# Patient Record
Sex: Male | Born: 1945 | Race: White | Hispanic: No | Smoking: Never smoker
Health system: Southern US, Community
[De-identification: ages and names within clinical notes are randomized; demographics above are authoritative.]

---

## 2011-08-19 LAB — COMPREHENSIVE METABOLIC PANEL
Anion Gap: 10 (ref 7–16)
BUN: 10 mg/dL (ref 7–18)
Calcium, Total: 8.6 mg/dL (ref 8.5–10.1)
Chloride: 105 mmol/L (ref 98–107)
EGFR (African American): >60
EGFR (Non-African Amer.): >60
Potassium: 3.7 mmol/L (ref 3.5–5.1)
SGOT(AST): 43 U/L — ABNORMAL HIGH (ref 15–37)
Sodium: 143 mmol/L (ref 136–145)
Total Protein: 7.4 g/dL (ref 6.4–8.2)

## 2011-08-19 LAB — SALICYLATE LEVEL: Salicylates, Serum: <1.7 mg/dL

## 2011-08-19 LAB — CBC
HCT: 48.2 % (ref 40.0–52.0)
MCH: 31.2 pg (ref 26.0–34.0)
MCV: 89 fL (ref 80–100)
Platelet: 249 10*3/uL (ref 150–440)
RDW: 13.3 % (ref 11.5–14.5)
WBC: 11.7 10*3/uL — ABNORMAL HIGH (ref 3.8–10.6)

## 2011-08-19 LAB — ETHANOL: Ethanol %: 0.289 % — ABNORMAL HIGH (ref 0.000–0.080)

## 2011-08-20 ENCOUNTER — Inpatient Hospital Stay: Payer: Self-pay | Admitting: Psychiatry

## 2011-08-20 LAB — DRUG SCREEN, URINE
Amphetamines, Ur Screen: NEGATIVE (ref ?–1000)
Barbiturates, Ur Screen: NEGATIVE (ref ?–200)
Benzodiazepine, Ur Scrn: POSITIVE (ref ?–200)
Cannabinoid 50 Ng, Ur ~~LOC~~: NEGATIVE (ref ?–50)
Cocaine Metabolite,Ur ~~LOC~~: NEGATIVE (ref ?–300)
Methadone, Ur Screen: NEGATIVE (ref ?–300)
Opiate, Ur Screen: NEGATIVE (ref ?–300)
Phencyclidine (PCP) Ur S: NEGATIVE (ref ?–25)
Tricyclic, Ur Screen: NEGATIVE (ref ?–1000)

## 2014-02-21 IMAGING — CT CT HEAD WITHOUT CONTRAST
2 series · 16 of 30 positions shown, 20 images · non-contrast
Comparison: none

REASON FOR EXAM: FALL, ABRASIONS TO HEAD
COMMENTS:   May transport without cardiac monitor

[Series 2: without · axial · non-contrast · 0.43mm/px · z∈[-44,+86]mm · 13 of 32 slices shown, 17 images]
[im 3/32  brain]
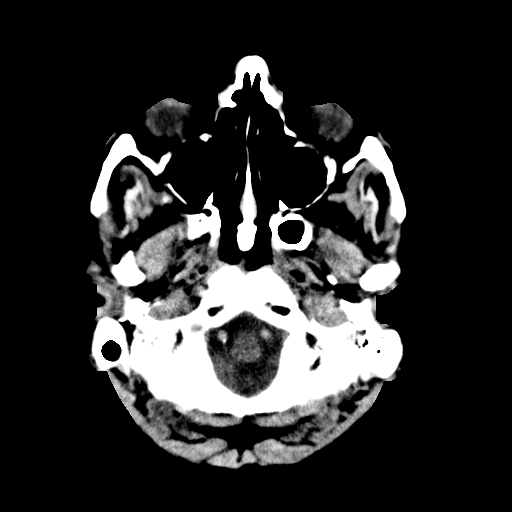
[im 3/32  bone]
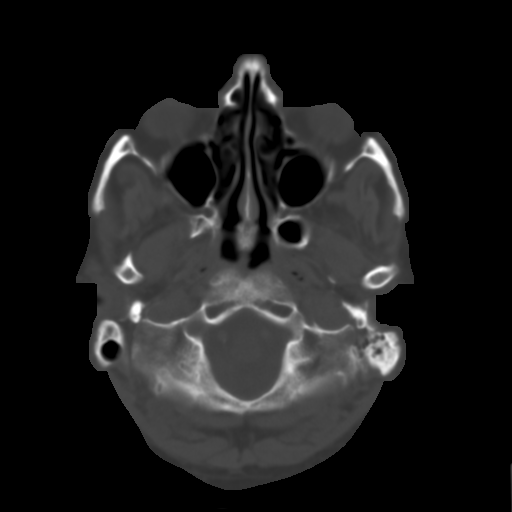
[im 5/32  brain]
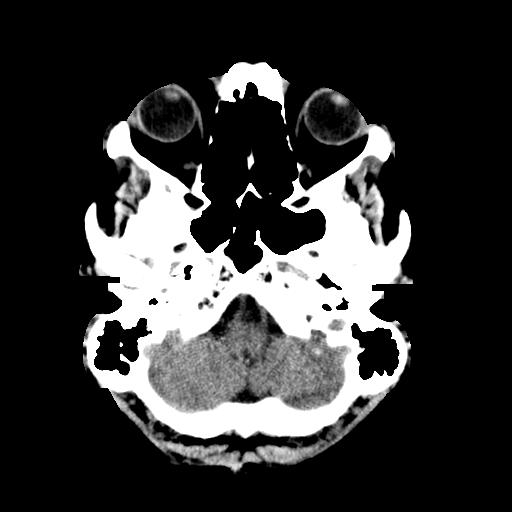
[im 7/32  brain]
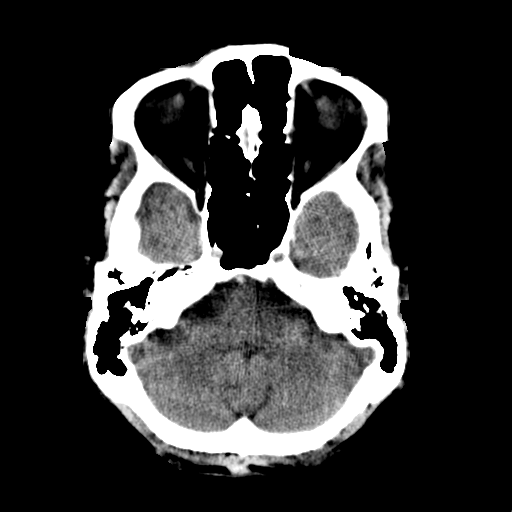
[im 9/32  brain]
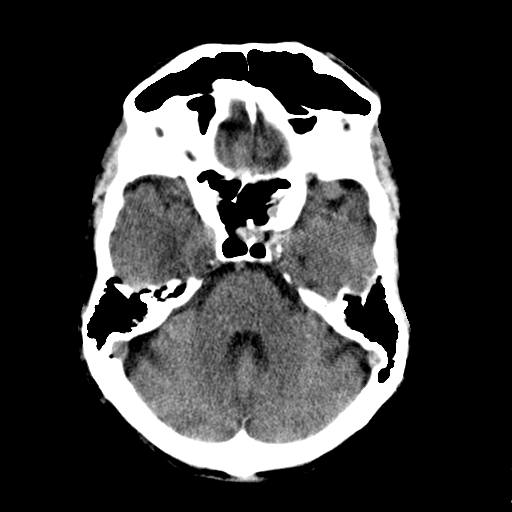
[im 12/32  brain]
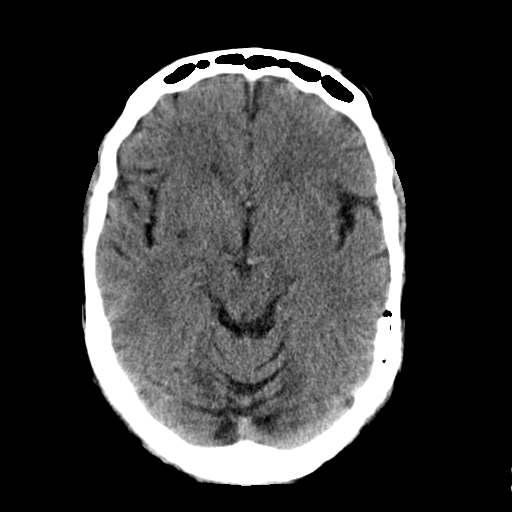
[im 12/32  bone]
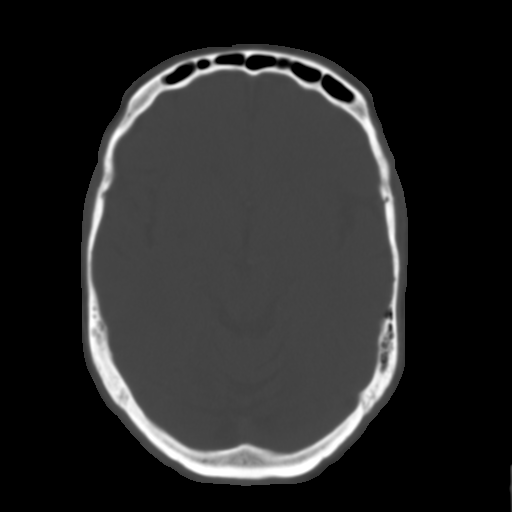
[im 14/32  brain]
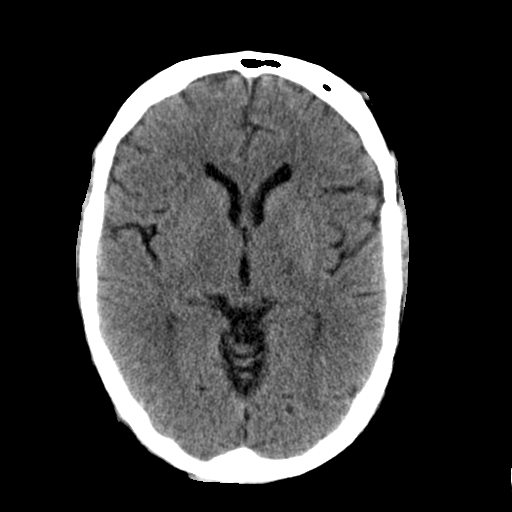
[im 16/32  brain]
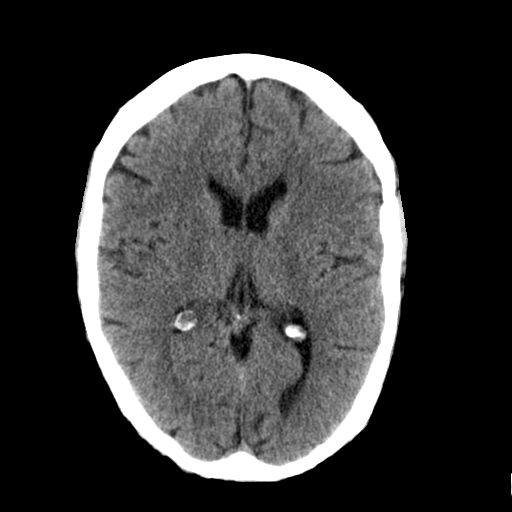
[im 18/32  brain]
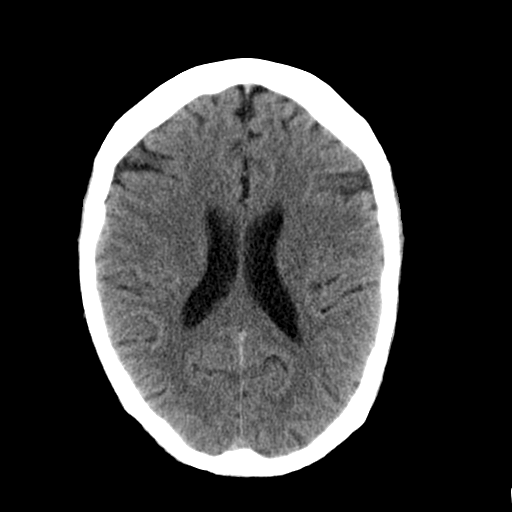
[im 20/32  brain]
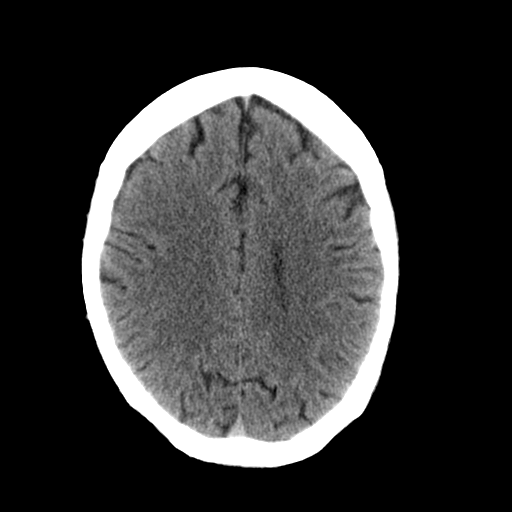
[im 20/32  bone]
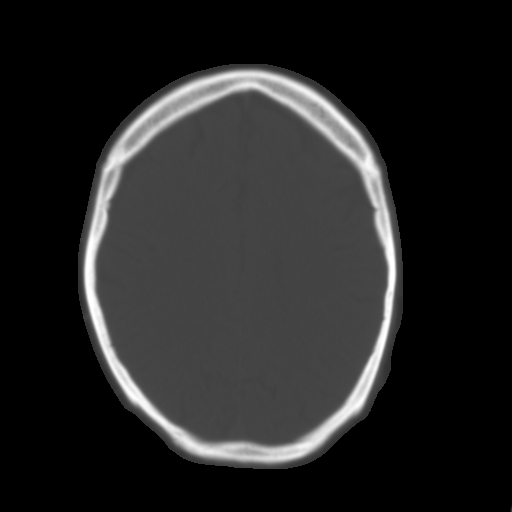
[im 23/32  brain]
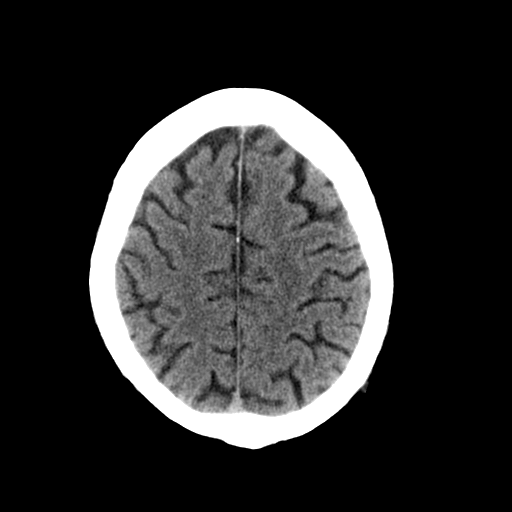
[im 25/32  brain]
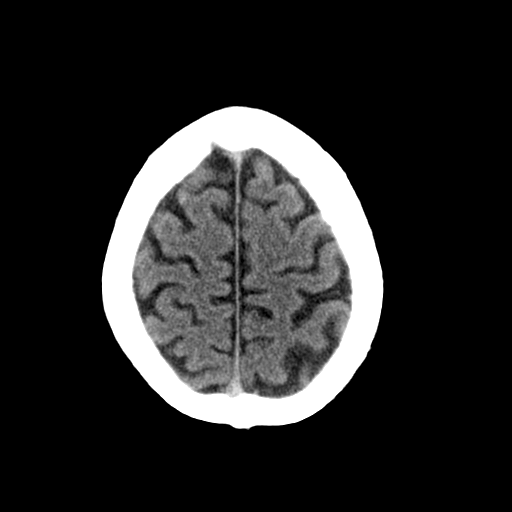
[im 27/32  brain]
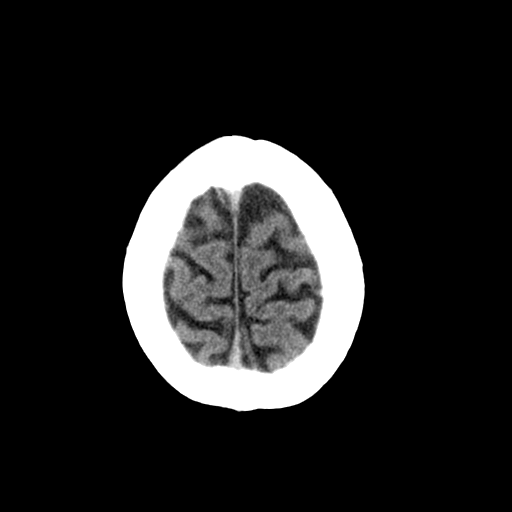
[im 29/32  brain]
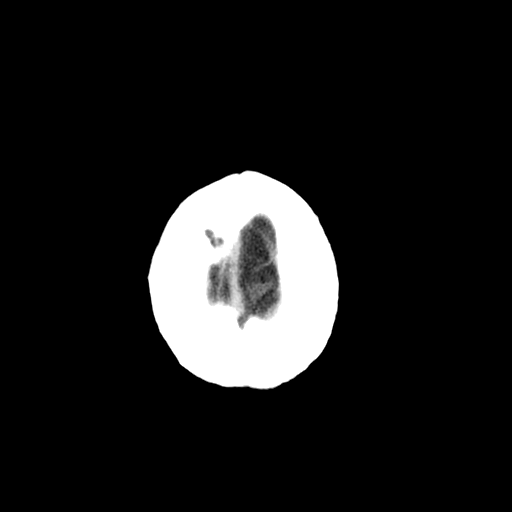
[im 29/32  bone]
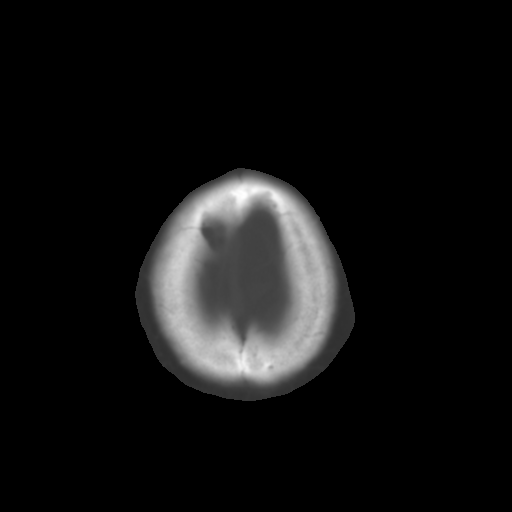

[Series 3: bone · axial · 0.43mm/px · z∈[-44,+1]mm · 3 of 32 slices shown]
[im 3/32  bone]
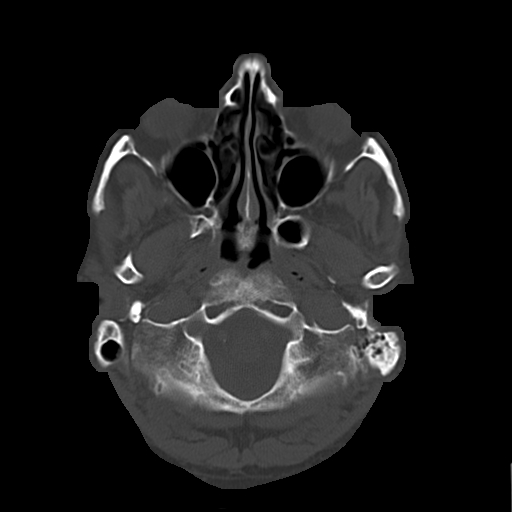
[im 7/32  bone]
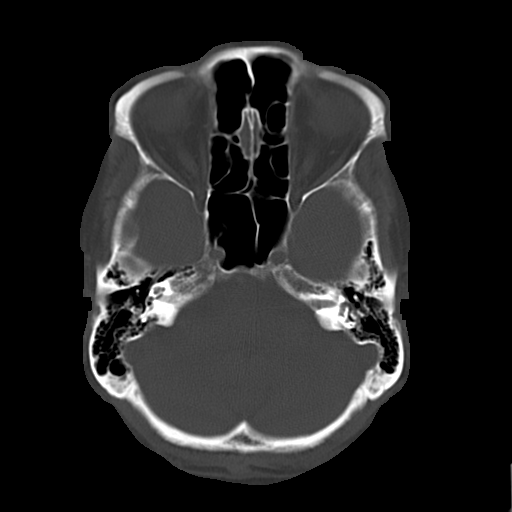
[im 12/32  bone]
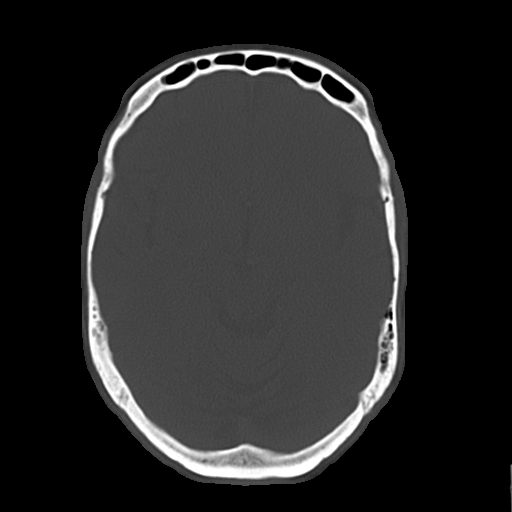

[16 of 30 positions shown; findings below may reference images not displayed]

PROCEDURE:     CT  - CT HEAD WITHOUT CONTRAST  - August 19, 2011 [DATE]

RESULT:     Axial noncontrast CT scanning was performed through the brain
with reconstructions 5 mm intervals and slice thicknesses.

There is very mild diffuse cerebral and cerebellar atrophy. The ventricles
are normal in size and position. There is basal ganglia calcification on the
left. There is no intracranial mass effect. There is no evidence of an
evolving ischemic infarction nor evidence of an intracranial hemorrhage. At
bone window settings the observed portions of the paranasal sinuses and
mastoid air cells are clear. There is no evidence of an acute skull fracture.
IMPRESSION: There is no acute intracranial abnormality.

A preliminary report was sent to the [HOSPITAL] the conclusion
of the study.

## 2014-04-22 NOTE — Discharge Summary (Signed)
PATIENT NAME:  Justin Cortez, Justin Cortez MR#:  [id number] DATE OF BIRTH:  03-24-1945  DATE OF ADMISSION:  08/20/2011 DATE OF DISCHARGE:    HOSPITAL COURSE: See dictated history and physical for details of admission. This is a 69 year old man who presented to the Emergency Room requesting detox from alcohol. He denied suicidal ideation. Was mildly anxious. Not depressed. He was admitted to the unit and put on the CIWA protocol. He did have high blood pressure and high pulse and was given several doses of lorazepam but his tremor resolved quickly. The patient did not appear to be sick. No signs of delirium. He was only minimally participating in groups. He was offered the opportunity to go either to a long-term inpatient program to go to the intensive outpatient program but he has declined both of these. He says he's already seen at the [facility] in [place] and intends to follow-up there. He has been counseled about the importance of also engaging in Alcoholics Anonymous so that he has every day 24 hour a day contacts and support to help with staying sober. Substance abuse education has been done with an emphasis on the importance of really getting into sustained treatment. There was no indication to start any psychiatric medicine. The patient did continue to run high blood pressures even with detox. I have counseled him that he may very well have hypertension. I have started him on hydrochlorothiazide 25 mg a day which has helped a little bit. He is counseled that he should follow-up with his primary care doctor and for the time being stay on the blood pressure medicine.   DISCHARGE MEDICATION: Hydrochlorothiazide 25 mg p.o. daily.   LABORATORY RESULTS: Tests done in the Emergency Room showed a TSH normal at 2.05. Alcohol level 289. Chemistries normal except for an AST elevated at 43. Drug screen positive for benzodiazepines. CBC showed a slightly high white blood cell count at 11.7,  otherwise normal. Salicylates undetectable. Head CT because of a fall was normal.   MENTAL STATUS EXAM AT DISCHARGE: Neatly dressed and groomed man. Alert and oriented. Cooperative. Good eye contact. Normal psychomotor activity. Speech normal in rate, tone, and volume. Affect slightly blunted but not severely so. Mood stated as good. Thoughts are lucid with no evidence of loosening of associations or delusional thinking. Denies suicidal or homicidal ideation. Denies hallucinations. Shows reasonable insight and judgment. Normal intelligence. Good short and long-term memory.   DISPOSITION: [facility] Also encouraged to engage in Alcoholics Anonymous.   DIAGNOSES PRINCIPLE AND PRIMARY:  AXIS I: Alcohol dependence.   SECONDARY DIAGNOSES:  AXIS I: No further.   AXIS II: Deferred.   AXIS III: Hypertension.   AXIS IV: Moderate stress from recent social losses and isolation.   AXIS V: Functioning at time of discharge 56.   ____________________________ Gonzella Lex, MD jtc:drc D: 08/23/2011 12:22:34 ET T: 08/23/2011 14:25:14 ET JOB#: [id number]  cc: Gonzella Lex, MD, <Dictator> Gonzella Lex MD ELECTRONICALLY SIGNED 08/23/2011 17:16

## 2014-04-22 NOTE — Consult Note (Signed)
1PM Met with this patient at the request of Dr. Garald Braver, PsyD, HSP-P, to explore possible admission to the CD-IOP upon this inpatient discharge. Patient was oriented to the CD-IOP however he indicated plans to attend to treatment needs near his family back home in [place], [place] ([place] in the [place] area especially thru the [employer].  He further indicated present conflict in intimate relationship as motivating factor to return to home town as well. Dr. Gavin Pound was verbally informed of patient?s plans at this discharge. Encourage treatment in [place], [place] area.    Electronic Signatures: Laqueta Due (PsyD)  (Signed on 19-Aug-13 13:31)  Authored  Last Updated: 19-Aug-13 13:31 by Laqueta Due (PsyD)

## 2014-04-22 NOTE — H&P (Signed)
PATIENT NAME:  Justin Cortez, Justin Cortez MR#:  [id number] DATE OF BIRTH:  11-Oct-1945  DATE OF ADMISSION:  08/20/2011  IDENTIFYING INFORMATION:  The patient is a 69 year old man who presented voluntarily to the Emergency Room.   CHIEF COMPLAINT: "I just need detox."   HISTORY OF PRESENT ILLNESS: The patient states that he has been drinking and has felt like it has been out of control recently. His recent alcohol consumption has been about two thirds of a bottle of liquor per day. He has been going at that rate for a few weeks now. He feels like he needs to come into the hospital to detox because he has not been able to stop on his own. His mood has been somewhat anxious, but he is not feeling overly depressed. He does feel like he has a lot of stress in his life that may be contributing to his continuing to drink. He is separating from his current girlfriend, he is selling a house, he is going to have to move into a new place which will mean displacing his ex-wife. Nevertheless, he is not feeling particularly depressed, not feeling sad or hopeless. He denies any suicidal or homicidal ideation. He denies that he is using any other prescription drugs or drug abuse, although he then clarifies that he does take Xanax 0.25 mg a day.   PAST PSYCHIATRIC HISTORY: No previous psychiatric hospitalizations. No history of suicide attempts. He denies any history of violence. Many years ago he had been in a detox facility, but that was a long time ago. He denies any history of suicide attempts. He says that he gets prescribed Xanax 0.25 mg a day by his doctor at the [facility] where he attends a group that is supposed to help with substance abuse problems.   PAST MEDICAL HISTORY: The patient has elevated cholesterol. No other significant ongoing medical problems.   SOCIAL HISTORY: He is a retired. 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 He is divorced but has a girlfriend he has  been living with. It sounds like he and the girlfriend are going to be separating. He has one daughter who is an adult. He indicates that he does have a place to live and feels like he has good social support.   FAMILY HISTORY: No significant family history of mental illness.   CURRENT MEDICATION: Xanax 0.25 mg per day.   ALLERGIES: Penicillin.   REVIEW OF SYSTEMS: Complains of feeling a little bit tired, a little bit anxious. He has been able to eat well. He denies suicidal ideation. He is not feeling overly sad. No psychotic symptoms.   MENTAL STATUS EXAM: A disheveled man, looks his stated age, cooperative and pleasant in the interview. Good eye contact. Normal psychomotor activity. Speech is normal in rate, tone, and volume. Affect is somewhat blunted and anxious. Mood is stated as being okay. Thoughts are lucid with no evidence of loosening of associations or delusional thinking. He denies any auditory or visual hallucinations. He denies suicidal or homicidal ideation. Short-term and longer-term memory are grossly intact. Intelligence is average. Alert and oriented x4. Recent judgment and insight intact.   PHYSICAL EXAMINATION:  GENERAL: The patient is somewhat disheveled.   SKIN: His skin is deeply tanned. No acute skin lesions.   HEENT: Pupils are equal and reactive. Face symmetric. Oral mucosa normal.   LUNGS: Clear to auscultation with no extra sounds.   HEART: Regular rate and rhythm.  ABDOMEN: Soft, nontender, normal bowel sounds.   NEUROLOGICAL: Cranial nerves symmetric strength and reflexes symmetric and normal throughout. He is not having a gross tremor. He is able to move around and has full range of motion in all extremities and normal gait.   VITAL SIGNS: Blood pressure most recently read as 113/70, respirations 18, pulse 78 temperature 98.4.   LABORATORY, DIAGNOSTIC AND RADIOLOGICAL DATA: On admission to the Emergency Room today, drug screen positive for  benzodiazepines. Chemistry panel shows AST slightly elevated at 43, otherwise normal. Alcohol level 289. White blood cell count 11.7, otherwise CBC normal. Thyroid stimulating hormone normal. CT scan of the head because of a recent fall is normal.   ASSESSMENT: A 69 year old man with alcohol dependence. He presents to the hospital requesting detox. No history of seizures or delirium tremens. He does not have a history of complicated alcohol withdrawal. He is not abusing other drugs. No other clear complicating psychiatric problem. The patient can benefit from admission for safe detox.   TREATMENT PLAN: Admit to Psychiatry. Alcohol withdrawal orders in place. Monitor vital signs and response to detox. Engage patient in groups and activities especially focused on substance recovery. Social Work will work with him on confirming his social situation and outpatient follow-up.   DIAGNOSIS, PRINCIPAL AND PRIMARY:  AXIS I: Alcohol dependence.   SECONDARY DIAGNOSES:  AXIS I: No further.   AXIS II: No diagnosis.   AXIS III: Elevated cholesterol.   AXIS IV: Moderate stress from breaking up with his girlfriend and the results with alcohol use.   AXIS V: Functioning at time of evaluation 76.    ____________________________ Gonzella Lex, MD jtc:cbb D: 08/20/2011 16:32:43 ET T: 08/21/2011 08:06:25 ET JOB#: [id number]  cc: Gonzella Lex, MD, <Dictator> Gonzella Lex MD ELECTRONICALLY SIGNED 08/21/2011 16:48

## 2014-04-22 NOTE — Consult Note (Signed)
Brief Consult Note: Comments: Psychiatry: Patient needs admission for alcohol withdrawl. Admit orders and h&P done.  Electronic Signatures: Gonzella Lex (MD)  (Signed 17-Aug-13 15:05)  Authored: Brief Consult Note   Last Updated: 17-Aug-13 15:05 by Gonzella Lex (MD)

## 2017-12-15 ENCOUNTER — Ambulatory Visit: Payer: Self-pay | Admitting: Urology

## 2017-12-25 ENCOUNTER — Ambulatory Visit (INDEPENDENT_AMBULATORY_CARE_PROVIDER_SITE_OTHER): Payer: Medicare Other | Admitting: Urology

## 2017-12-25 ENCOUNTER — Other Ambulatory Visit: Payer: Self-pay

## 2017-12-25 ENCOUNTER — Encounter: Payer: Self-pay | Admitting: Urology

## 2017-12-25 VITALS — BP 148/83 | HR 71 | Ht 72.0 in | Wt 206.0 lb

## 2017-12-25 DIAGNOSIS — C61 Malignant neoplasm of prostate: Secondary | ICD-10-CM | POA: Diagnosis not present

## 2017-12-25 LAB — URINALYSIS, COMPLETE
Bilirubin, UA: NEGATIVE
Glucose, UA: NEGATIVE
Ketones, UA: NEGATIVE
Leukocytes, UA: NEGATIVE
Nitrite, UA: NEGATIVE
Protein, UA: NEGATIVE
RBC, UA: NEGATIVE
Specific Gravity, UA: 1.02 (ref 1.005–1.030)
Urobilinogen, Ur: 0.2 mg/dL (ref 0.2–1.0)
pH, UA: 5.5 (ref 5.0–7.5)

## 2017-12-25 LAB — MICROSCOPIC EXAMINATION
Epithelial Cells (non renal): NONE SEEN /hpf (ref 0–10)
WBC, UA: NONE SEEN /hpf (ref 0–5)

## 2017-12-25 NOTE — Progress Notes (Signed)
12/25/2017 2:12 PM   Justin Cortez 1945-01-12 [id number]  Referring provider: No referring provider defined for this encounter.  Chief Complaint  Patient presents with  . Establish Care   Urologic history: 1. T1c adenocarcinoma the prostate  -Biopsy 12/2011 at [facility]; PSA 5 range; Gleason 3+4 5% 1 core  -[facility]; 58 g gland; Gleason 3+3 RLB (5%), RML (10%)  -Elected active surveillance  -Rebiopsy 04/2013 [facility]; 63 g gland; PSA 5.56; 19 core biopsy; Gleason 3+3 right side 5%, 10%  -Prostate MRI 03/2016 no suspicious lesions, adenopathy or evidence ECE  HPI: 72 year old male presents to establish local urologic care.  He was initially diagnosed with prostate cancer in 2013 at the [place].  He has had 3 biopsies as above the last in 2015.  He has seen Dr. Jacqlyn Larsen at [facility] since December 2015.  His PSA since that time has ranged from the upper 5-upper 6 range.  He last saw Dr. Jacqlyn Larsen in April 2019.  PSA at that visit was 6.08.  He denies bothersome lower urinary tract symptoms.  Denies dysuria, gross hematuria or flank/abdominal/pelvic/scrotal pain.   PMH: No past medical history on file.  Surgical History: History reviewed. No pertinent surgical history.  Home Medications:  Allergies as of 12/25/2017      Reactions   Penicillins Rash      Medication List       Accurate as of December 25, 2017  2:12 PM. Always use your most recent med list.        atorvastatin 20 MG tablet Commonly known as:  LIPITOR Take 80 mg by mouth.   citalopram 10 MG tablet Commonly known as:  CELEXA Take 20 mg by mouth.   gabapentin 100 MG capsule Commonly known as:  NEURONTIN Take 100 mg by mouth.   VITAMIN A PO Take 500 mg by mouth.   Vitamin D3 25 MCG (1000 UT) Caps Take 2,000 Units by mouth.       Allergies:  Allergies  Allergen Reactions  . Penicillins Rash    Family History: No family history on file.  Social History:  reports that  he has never smoked. He has never used smokeless tobacco. No history on file for alcohol and drug.  ROS: UROLOGY Frequent Urination?: No Hard to postpone urination?: No Burning/pain with urination?: No Get up at night to urinate?: Yes Leakage of urine?: No Urine stream starts and stops?: Yes Trouble starting stream?: No Do you have to strain to urinate?: No Blood in urine?: No Urinary tract infection?: No Sexually transmitted disease?: No Injury to kidneys or bladder?: No Painful intercourse?: No Weak stream?: No Erection problems?: No Penile pain?: No  Gastrointestinal Nausea?: No Vomiting?: No Indigestion/heartburn?: No Diarrhea?: No Constipation?: No  Constitutional Fever: No Night sweats?: No Weight loss?: No Fatigue?: No  Skin Skin rash/lesions?: No Itching?: No  Eyes Blurred vision?: No Double vision?: No  Ears/Nose/Throat Sore throat?: No Sinus problems?: No  Hematologic/Lymphatic Swollen glands?: No Easy bruising?: No  Cardiovascular Leg swelling?: No Chest pain?: No  Respiratory Cough?: No Shortness of breath?: No  Endocrine Excessive thirst?: No  Musculoskeletal Back pain?: No Joint pain?: Yes  Neurological Headaches?: No Dizziness?: No  Psychologic Depression?: No Anxiety?: Yes  Physical Exam: BP (!) 148/83   Pulse 71   Ht 6' (1.829 m)   Wt 206 lb (93.4 kg)   BMI 27.94 kg/m   Constitutional:  Alert and oriented, No acute distress. HEENT: [place] AT, moist  mucus membranes.  Trachea midline, no masses. Cardiovascular: No clubbing, cyanosis, or edema. Respiratory: Normal respiratory effort, no increased work of breathing. GI: Abdomen is soft, nontender, nondistended, no abdominal masses GU: No CVA tenderness.  Prostate 50 g, smooth without nodules Lymph: No cervical or inguinal lymphadenopathy. Skin: No rashes, bruises or suspicious lesions. Neurologic: Grossly intact, no focal deficits, moving all 4 extremities. Psychiatric:  Normal mood and affect.   Assessment & Plan:   72 year old male with focal adenocarcinoma the prostate diagnosed in 2013.  His initial core showed Gleason 3+4 adenocarcinoma however 2 subsequent biopsies have shown Gleason 3+3.  His PSA has been stable since 2015.  A prostate MRI in 2018 showed no areas suspicious for Gleason 7 or greater prostate cancer.  DRE today is benign.  A PSA was drawn today and is stable he will continue semiannual follow-up.  Return in about 6 months (around 06/26/2018) for Recheck, PSA.   Abbie Sons, [place] [address], [place], [place] [address] [phone]

## 2017-12-26 LAB — PSA: Prostate Specific Ag, Serum: 6.5 ng/mL — ABNORMAL HIGH (ref 0.0–4.0)

## 2018-06-14 ENCOUNTER — Other Ambulatory Visit: Payer: Medicare Other

## 2018-06-18 ENCOUNTER — Ambulatory Visit: Payer: Medicare Other | Admitting: Urology

## 2022-04-27 ENCOUNTER — Ambulatory Visit: Payer: Medicare Other | Admitting: Urology

## 2022-04-27 ENCOUNTER — Encounter: Payer: Self-pay | Admitting: Urology

## 2022-04-27 VITALS — BP 137/77 | HR 77 | Ht 73.0 in | Wt 210.0 lb

## 2022-04-27 DIAGNOSIS — C61 Malignant neoplasm of prostate: Secondary | ICD-10-CM

## 2022-04-27 DIAGNOSIS — R972 Elevated prostate specific antigen [PSA]: Secondary | ICD-10-CM

## 2022-04-27 NOTE — Progress Notes (Signed)
I, DeAsia L Maxie,acting as a scribe for Riki Altes, MD.,have documented all relevant documentation on the behalf of Riki Altes, MD,as directed by  Riki Altes, MD while in the presence of Riki Altes, MD.   Albin Fischer Abdulla,acting as a scribe for Riki Altes, MD.,have documented all relevant documentation on the behalf of Riki Altes, MD, as directed by  Riki Altes, MD while in the presence of Riki Altes, MD.   04/27/2022 4:32 PM   Justin Cortez 1945/04/12 [id number]  Referring provider: Dallas Breeding, MD [address] [place],  [place] [address]  Chief Complaint  Patient presents with   Prostate Cancer   Urologic history: 1. T1c adenocarcinoma the prostate -Biopsy 12/2011 at [facility]; PSA 5 range; Gleason 3+4 5% 1 core -[facility]; 58 g gland; Gleason 3+3 RLB (5%), RML (10%) -Elected active surveillance -Rebiopsy 04/2013 [facility] [place]; 63 g gland; PSA 5.56; 19 core biopsy; Gleason 3+3 right side 5%, 10% -Prostate MRI 03/2016 no suspicious lesions, adenopathy or evidence ECE  HPI: 77 y.o. male presents to re-establish urologic care.    Initially seen in December 2019 and a 6 month follow up was recommended, however he moved to [place] after 2019  and has been followed by [facility] in [facility] by Dr. Shana Chute. He thinks he has had at least one other biopsy since that time and has been followed every 6 months.  He was also started on Finasteride. No voiding complaints today. He did come in last week and filled out a record request.  Home Medications:  Allergies as of 04/27/2022       Reactions   Penicillins Rash        Medication List        Accurate as of April 27, 2022  4:32 PM. If you have any questions, ask your nurse or doctor.          atorvastatin 20 MG tablet Commonly known as: LIPITOR Take 80 mg by mouth.   citalopram 10 MG tablet Commonly known as: CELEXA Take 20  mg by mouth.   gabapentin 100 MG capsule Commonly known as: NEURONTIN Take 100 mg by mouth.   VITAMIN A PO Take 500 mg by mouth.   Vitamin D3 25 MCG (1000 UT) Caps Take 2,000 Units by mouth.        Allergies:  Allergies  Allergen Reactions   Penicillins Rash     Social History:  reports that he has never smoked. He has never used smokeless tobacco. No history on file for alcohol use and drug use.    Physical Exam: BP 137/77   Pulse 77   Ht 6\' 1"  (1.854 m)   Wt 210 lb (95.3 kg)   BMI 27.71 kg/m   Constitutional:  Alert and oriented, No acute distress. HEENT: [place] AT, moist mucus membranes.  Trachea midline, no masses. Cardiovascular: No clubbing, cyanosis, or edema. Respiratory: Normal respiratory effort, no increased work of breathing. Psychiatric: Normal mood and affect.  Assessment & Plan:    1. T1c low risk prostate cancer, on surveillance PSA drawn today. 6 month follow up with PSA/DRE. Once his records from [facility] are received, we will contact him if he needs any additional studies/imaging.  I have reviewed the above documentation for accuracy and completeness, and I agree with the above.   Riki Altes, MD  [facility] [address]  [place], [place] [address] [phone]

## 2022-04-28 ENCOUNTER — Telehealth: Payer: Self-pay | Admitting: *Deleted

## 2022-04-28 LAB — PSA: Prostate Specific Ag, Serum: 2.6 ng/mL (ref 0.0–4.0)

## 2022-04-28 NOTE — Telephone Encounter (Signed)
-----   Message from Riki Altes, MD sent at 04/28/2022  7:32 AM EDT ----- PSA was 2.6.  Awaiting records from his previous urologist

## 2022-04-28 NOTE — Telephone Encounter (Signed)
Notified patient as instructed, patient pleased °

## 2022-10-21 ENCOUNTER — Other Ambulatory Visit: Payer: Self-pay | Admitting: *Deleted

## 2022-10-21 DIAGNOSIS — C61 Malignant neoplasm of prostate: Secondary | ICD-10-CM

## 2022-10-24 ENCOUNTER — Other Ambulatory Visit: Payer: Medicare Other

## 2022-10-24 DIAGNOSIS — C61 Malignant neoplasm of prostate: Secondary | ICD-10-CM | POA: Diagnosis not present

## 2022-10-25 LAB — PSA: Prostate Specific Ag, Serum: 2.8 ng/mL (ref 0.0–4.0)

## 2022-10-27 ENCOUNTER — Encounter: Payer: Self-pay | Admitting: Urology

## 2022-10-27 ENCOUNTER — Ambulatory Visit: Payer: Medicare Other | Admitting: Urology

## 2022-10-27 VITALS — BP 106/70 | HR 93 | Ht 73.0 in | Wt 215.0 lb

## 2022-10-27 DIAGNOSIS — C61 Malignant neoplasm of prostate: Secondary | ICD-10-CM | POA: Diagnosis not present

## 2022-10-27 MED ORDER — FINASTERIDE 5 MG PO TABS: 5.0000 mg | ORAL_TABLET | Freq: Every day | ORAL | 3 refills | Status: DC

## 2022-10-27 NOTE — Progress Notes (Signed)
I, Maysun Anabel Bene, acting as a scribe for Riki Altes, MD., have documented all relevant documentation on the behalf of Riki Altes, MD, as directed by Riki Altes, MD while in the presence of Riki Altes, MD.  10/27/2022 2:55 PM   Justin Cortez [birth year] [id number]   Chief Complaint  Patient presents with   Elevated PSA   Urologic history: 1. T1c adenocarcinoma the prostate Biopsy 12/2011 at [facility]; PSA 5 range; Gleason 3+4 5% 1 core [facility]; 58 g gland; Gleason 3+3 RLB (5%), RML (10%) Elected active surveillance Rebiopsy 04/2013 [facility] [place]; 63 g gland; PSA 5.56; 19 core biopsy; Gleason 3+3 right side 5%, 10% Prostate MRI 03/2016 no suspicious lesions, adenopathy or evidence ECE  HPI: Justin Cortez is a 77 y.o. male presents for a 6 month follow-up.   His [facility] records were received and reviewed. His last prostate biopsy was October 2020, which showed Gleason 3+3 adenocarcinoma 5% left base He was started on finasteride September 2021 and the PSA October 2023 was 3.5. He has done well since his last visit.  PSA April 2024 was 2.6 and October 2024, 2.8 (uncorrected).  He remains on finasteride.    Home Medications:  Allergies as of 10/27/2022       Reactions   Penicillins Rash        Medication List        Accurate as of October 27, 2022  2:55 PM. If you have any questions, ask your nurse or doctor.          STOP taking these medications    gabapentin 100 MG capsule Commonly known as: NEURONTIN Stopped by: Riki Altes       TAKE these medications    atorvastatin 20 MG tablet Commonly known as: LIPITOR Take 80 mg by mouth.   citalopram 10 MG tablet Commonly known as: CELEXA Take 20 mg by mouth.   finasteride 5 MG tablet Commonly known as: PROSCAR Take 1 tablet (5 mg total) by mouth daily.   VITAMIN A PO Take 500 mg by mouth.   Vitamin D3 25 MCG (1000 UT) Caps Take  2,000 Units by mouth.        Allergies:  Allergies  Allergen Reactions   Penicillins Rash   Social History:  reports that he has never smoked. He has never used smokeless tobacco. No history on file for alcohol use and drug use.   Physical Exam: BP 106/70   Pulse 93   Ht 6\' 1"  (1.854 m)   Wt 215 lb (97.5 kg)   BMI 28.37 kg/m   Constitutional:  Alert and oriented, No acute distress. HEENT: [place] AT, moist mucus membranes.  Trachea midline, no masses. Cardiovascular: No clubbing, cyanosis, or edema. Respiratory: Normal respiratory effort, no increased work of breathing. GI: Abdomen is soft, nontender, nondistended, no abdominal masses GU: Prostate 60+ cc, smooth without nodules. Skin: No rashes, bruises or suspicious lesions. Neurologic: Grossly intact, no focal deficits, moving all 4 extremities. Psychiatric: Normal mood and affect.   Assessment & Plan:    1. T1c low-risk prostate cancer Desires to continue active surveillance.  Finasteride refilled.  6 month follow-up office visit with PSA.   I have reviewed the above documentation for accuracy and completeness, and I agree with the above.   Riki Altes, MD  [facility] [address] [place], [place] [address] [phone]

## 2022-10-29 ENCOUNTER — Encounter: Payer: Self-pay | Admitting: Urology

## 2023-04-17 ENCOUNTER — Other Ambulatory Visit: Payer: Self-pay

## 2023-04-17 DIAGNOSIS — C61 Malignant neoplasm of prostate: Secondary | ICD-10-CM

## 2023-04-18 LAB — PSA: Prostate Specific Ag, Serum: 2.4 ng/mL (ref 0.0–4.0)

## 2023-04-20 ENCOUNTER — Ambulatory Visit (INDEPENDENT_AMBULATORY_CARE_PROVIDER_SITE_OTHER): Payer: Self-pay | Admitting: Urology

## 2023-04-20 ENCOUNTER — Encounter: Payer: Self-pay | Admitting: Urology

## 2023-04-20 VITALS — BP 134/82 | HR 68 | Ht 73.0 in | Wt 210.0 lb

## 2023-04-20 DIAGNOSIS — C61 Malignant neoplasm of prostate: Secondary | ICD-10-CM | POA: Diagnosis not present

## 2023-04-20 NOTE — Progress Notes (Signed)
    I, Justin Cortez, acting as a scribe for Justin Knapp, MD., have documented all relevant documentation on the behalf of Justin Knapp, MD, as directed by Justin Knapp, MD while in the presence of Justin Knapp, MD.  04/20/2023 10:47 PM   Justin Cortez May 13, 1945 [id number]  Chief Complaint  Patient presents with   Prostate Cancer   Urologic history: 1. T1c adenocarcinoma the prostate Biopsy 12/2011 at [facility]; PSA 5 range; Gleason 3+4 5% 1 core [facility]biopsy [facility] 01[facility]2012; 58 g gland; Gleason 3+3 RLB (5%), RML (10%) Elected active surveillance Rebiopsy 04/2013 [facility] [place]; 63 g gland; PSA 5.56; 19 core biopsy; Gleason 3+3 right side 5%, 10% Prostate MRI 03/2016 no suspicious lesions, adenopathy or evidence ECE  HPI: Justin Cortez is a 78 y.o. male presents for a 6 month follow-up.  No complaints since last visit.  No bothersome lower urinary tract symptoms.  Denies dysuria, gross hematuria.  Denies flank, abdominal, or pelvic pain.  PSA 04/17/2023 remains stable at 2.4 (uncorrected).  PSA trend   Prostate Specific Ag, Serum  Latest Ref Rng 0.0 - 4.0 ng/mL  04/27/2022 2.6   10/24/2022 2.8   04/17/2023 2.4       Home Medications:  Allergies as of 04/20/2023       Reactions   Penicillins Rash        Medication List        Accurate as of April 20, 2023 10:47 PM. If you have any questions, ask your nurse or doctor.          atorvastatin 20 MG tablet Commonly known as: LIPITOR Take 80 mg by mouth.   citalopram 10 MG tablet Commonly known as: CELEXA Take 20 mg by mouth.   finasteride  5 MG tablet Commonly known as: PROSCAR  Take 1 tablet (5 mg total) by mouth daily.   VITAMIN A PO Take 500 mg by mouth.   Vitamin D3 25 MCG (1000 UT) Caps Take 2,000 Units by mouth.        Allergies:  Allergies  Allergen Reactions   Penicillins Rash    Social History:  reports that he has never smoked. He has never used smokeless  tobacco. No history on file for alcohol use and drug use.   Physical Exam: BP 134/82   Pulse 68   Ht 6\' 1"  (1.854 m)   Wt 210 lb (95.3 kg)   BMI 27.71 kg/m   Constitutional:  Alert and oriented, No acute distress. HEENT: [place] AT Respiratory: Normal respiratory effort, no increased work of breathing. Psychiatric: Normal mood and affect.  Assessment & Plan:    1. T1c low-risk prostate cancer Stable PSA desires to continue active surveillance.  Continue finasteride  Follow up 6 months with PSA/DRE.  If stable at the next visit, will move to annual visits with DRE and interim lab visit for a 6 month PSA.  I have reviewed the above documentation for accuracy and completeness, and I agree with the above.   Justin Knapp, MD  [facility] [address], [address] [place], [place] [address] [phone]

## 2023-04-24 ENCOUNTER — Other Ambulatory Visit: Payer: Medicare Other

## 2023-04-27 ENCOUNTER — Ambulatory Visit: Payer: Medicare Other | Admitting: Urology

## 2023-10-20 ENCOUNTER — Other Ambulatory Visit

## 2023-10-20 DIAGNOSIS — C61 Malignant neoplasm of prostate: Secondary | ICD-10-CM

## 2023-10-21 LAB — PSA: Prostate Specific Ag, Serum: 2.5 ng/mL (ref 0.0–4.0)

## 2023-10-25 ENCOUNTER — Ambulatory Visit: Admitting: Urology

## 2023-10-25 ENCOUNTER — Encounter: Payer: Self-pay | Admitting: Urology

## 2023-10-25 VITALS — BP 146/77 | HR 76 | Ht 73.0 in | Wt 215.0 lb

## 2023-10-25 DIAGNOSIS — C61 Malignant neoplasm of prostate: Secondary | ICD-10-CM

## 2023-10-25 MED ORDER — FINASTERIDE 5 MG PO TABS: 5.0000 mg | ORAL_TABLET | Freq: Every day | ORAL | 3 refills | Status: AC

## 2023-10-25 NOTE — Progress Notes (Signed)
 10/25/2023 9:43 AM   Justin Cortez [birth year] [id number]  Referring provider: No referring provider defined for this encounter.  Chief Complaint  Patient presents with   Follow-up   Urologic history:  1. T1c adenocarcinoma the prostate Biopsy 12/2011 at [facility]; PSA 5 range; Gleason 3+4 5% 1 core [facility]; 58 g gland; Gleason 3+3 RLB (5%), RML (10%) Elected active surveillance Rebiopsy 04/2013 [facility] [place]; 63 g gland; PSA 5.56; 19 core biopsy; Gleason 3+3 right side 5%, 10% Prostate MRI 03/2016 no suspicious lesions, adenopathy or evidence ECE Followed By [facility], [facility] by Dr. Liddie  [phone] and thinks he had 1 additional biopsy and was also started on finasteride .  Records were requested but not available   HPI: Justin Cortez is a 78 y.o. male presents for 58-month follow-up  No complaints since last visit.  No bothersome lower urinary tract symptoms.  Denies dysuria, gross hematuria.  Denies flank, abdominal, or pelvic pain.  PSA 10/20/2023 remains stable at 2.5 (uncorrected).   PSA trend (uncorrected)   Prostate Specific Ag, Serum  Latest Ref Rng 0.0 - 4.0 ng/mL  04/27/2022 2.6   10/24/2022 2.8   04/17/2023 2.4   10/20/2023 2.5    PMH:  Anxiety   Depression   Hemorrhoids   Hyperlipidemia   Melanoma (CMS-HCC)   Prostate cancer (CMS-HCC)   Surgical History:  BROW LIFT   PR EXCISION TUMOR SOFT TISS FACE/SCALP SUBQ 2+CM Left 06/25/2014  Procedure: EXCISION, TUMOR, SOFT TISSUE OF FACE OR SCALP, SUBCUTANEOUS; 2 CM OR GREATER; Surgeon: Wyvonna Wyvonna Milder, MD; Location: ASC OR [facility]; Service: Surgical Oncology   PR EXCISION TUMOR SOFT TISSUE BACK/FLANK SUBQ  < 3CM Right 06/25/2014  Procedure: EXCISION, TUMOR, SOFT TISSUE OF BACK OR FLANK, SUBCUTANEOUS; LESS THAN 3 CM; Surgeon: Wyvonna Wyvonna Milder, MD; Location: [facility] OR [facility]; Service: Surgical Oncology   PR LAYR CLOS WND FACE,FACIAL 2.5-5 CM Left 06/25/2014  Procedure: REPAIR,  INTERMEDIATE, WOUNDS OF FACE, EARS, EYELIDS, NOSE, LIPS &/OR MUCOUS MEMBRANES; 2.6CM TO 5.0CM; Surgeon: Viviana Rollo Hoof, MD; Location: ASC OR [facility]; Service: Plastics   SKIN BIOPSY   Home Medications:  Allergies as of 10/25/2023       Reactions   Penicillins Rash        Medication List        Accurate as of October 25, 2023  9:43 AM. If you have any questions, ask your nurse or doctor.          atorvastatin 20 MG tablet Commonly known as: LIPITOR Take 80 mg by mouth.   citalopram 10 MG tablet Commonly known as: CELEXA Take 20 mg by mouth.   finasteride  5 MG tablet Commonly known as: PROSCAR  Take 1 tablet (5 mg total) by mouth daily.   VITAMIN A PO Take 500 mg by mouth.   Vitamin D3 25 MCG (1000 UT) Caps Take 2,000 Units by mouth.        Allergies:  Allergies  Allergen Reactions   Penicillins Rash    Family History: No family history on file.  Social History:  reports that he has never smoked. He has never used smokeless tobacco. No history on file for alcohol use and drug use.   Physical Exam: BP (!) 146/77   Pulse 76   Ht 6' 1 (1.854 m)   Wt 215 lb (97.5 kg)   BMI 28.37 kg/m   Constitutional:  Alert, No acute distress. HEENT: [place] AT Respiratory: Normal respiratory effort, no  increased work of breathing. Psychiatric: Normal mood and affect.   Assessment & Plan:    1. T1c low-risk prostate cancer Stable PSA desires to continue active surveillance.  Finasteride  refilled Lab visit PSA 6 months Office visit 1 year PSA/DRE   Glendia [name], MD  [facility] [address], [address] [place], [place] [address] [phone]

## 2024-04-24 ENCOUNTER — Other Ambulatory Visit

## 2024-10-18 ENCOUNTER — Other Ambulatory Visit

## 2024-10-25 ENCOUNTER — Ambulatory Visit: Admitting: Urology
